# Patient Record
Sex: Female | Born: 2000 | Race: Black or African American | Hispanic: No | Marital: Single | Smoking: Never smoker
Health system: Southern US, Community
[De-identification: ages and names within clinical notes are randomized; demographics above are authoritative.]

## PROBLEM LIST (undated history)

## (undated) DIAGNOSIS — F88 Other disorders of psychological development: Secondary | ICD-10-CM

## (undated) DIAGNOSIS — Z8619 Personal history of other infectious and parasitic diseases: Secondary | ICD-10-CM

## (undated) DIAGNOSIS — F809 Developmental disorder of speech and language, unspecified: Secondary | ICD-10-CM

## (undated) HISTORY — DX: Developmental disorder of speech and language, unspecified: F80.9

## (undated) HISTORY — DX: Personal history of other infectious and parasitic diseases: Z86.19

## (undated) HISTORY — DX: Other disorders of psychological development: F88

---
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
## 2022-01-04 DIAGNOSIS — F329 Major depressive disorder, single episode, unspecified: Secondary | ICD-10-CM | POA: Diagnosis not present

## 2022-01-09 DIAGNOSIS — F4310 Post-traumatic stress disorder, unspecified: Secondary | ICD-10-CM | POA: Diagnosis not present

## 2022-01-11 DIAGNOSIS — F329 Major depressive disorder, single episode, unspecified: Secondary | ICD-10-CM | POA: Diagnosis not present

## 2022-01-18 DIAGNOSIS — F329 Major depressive disorder, single episode, unspecified: Secondary | ICD-10-CM | POA: Diagnosis not present

## 2022-01-22 DIAGNOSIS — Z419 Encounter for procedure for purposes other than remedying health state, unspecified: Secondary | ICD-10-CM | POA: Diagnosis not present

## 2022-01-25 DIAGNOSIS — F329 Major depressive disorder, single episode, unspecified: Secondary | ICD-10-CM | POA: Diagnosis not present

## 2022-02-01 DIAGNOSIS — F329 Major depressive disorder, single episode, unspecified: Secondary | ICD-10-CM | POA: Diagnosis not present

## 2022-02-08 DIAGNOSIS — F329 Major depressive disorder, single episode, unspecified: Secondary | ICD-10-CM | POA: Diagnosis not present

## 2022-02-12 DIAGNOSIS — F4310 Post-traumatic stress disorder, unspecified: Secondary | ICD-10-CM | POA: Diagnosis not present

## 2022-02-15 DIAGNOSIS — F329 Major depressive disorder, single episode, unspecified: Secondary | ICD-10-CM | POA: Diagnosis not present

## 2022-02-22 DIAGNOSIS — Z419 Encounter for procedure for purposes other than remedying health state, unspecified: Secondary | ICD-10-CM | POA: Diagnosis not present

## 2022-02-22 DIAGNOSIS — F329 Major depressive disorder, single episode, unspecified: Secondary | ICD-10-CM | POA: Diagnosis not present

## 2022-02-26 DIAGNOSIS — F329 Major depressive disorder, single episode, unspecified: Secondary | ICD-10-CM | POA: Diagnosis not present

## 2022-03-06 DIAGNOSIS — F329 Major depressive disorder, single episode, unspecified: Secondary | ICD-10-CM | POA: Diagnosis not present

## 2022-03-08 DIAGNOSIS — F4310 Post-traumatic stress disorder, unspecified: Secondary | ICD-10-CM | POA: Diagnosis not present

## 2022-03-13 DIAGNOSIS — F329 Major depressive disorder, single episode, unspecified: Secondary | ICD-10-CM | POA: Diagnosis not present

## 2022-03-14 DIAGNOSIS — F4310 Post-traumatic stress disorder, unspecified: Secondary | ICD-10-CM | POA: Diagnosis not present

## 2022-03-20 DIAGNOSIS — F4310 Post-traumatic stress disorder, unspecified: Secondary | ICD-10-CM | POA: Diagnosis not present

## 2022-03-24 DIAGNOSIS — Z419 Encounter for procedure for purposes other than remedying health state, unspecified: Secondary | ICD-10-CM | POA: Diagnosis not present

## 2022-03-27 DIAGNOSIS — F329 Major depressive disorder, single episode, unspecified: Secondary | ICD-10-CM | POA: Diagnosis not present

## 2022-04-03 DIAGNOSIS — F329 Major depressive disorder, single episode, unspecified: Secondary | ICD-10-CM | POA: Diagnosis not present

## 2022-04-10 DIAGNOSIS — F329 Major depressive disorder, single episode, unspecified: Secondary | ICD-10-CM | POA: Diagnosis not present

## 2022-04-17 DIAGNOSIS — F329 Major depressive disorder, single episode, unspecified: Secondary | ICD-10-CM | POA: Diagnosis not present

## 2022-04-24 DIAGNOSIS — Z419 Encounter for procedure for purposes other than remedying health state, unspecified: Secondary | ICD-10-CM | POA: Diagnosis not present

## 2022-04-24 DIAGNOSIS — F329 Major depressive disorder, single episode, unspecified: Secondary | ICD-10-CM | POA: Diagnosis not present

## 2022-04-26 DIAGNOSIS — F4310 Post-traumatic stress disorder, unspecified: Secondary | ICD-10-CM | POA: Diagnosis not present

## 2022-05-01 DIAGNOSIS — F329 Major depressive disorder, single episode, unspecified: Secondary | ICD-10-CM | POA: Diagnosis not present

## 2022-05-03 DIAGNOSIS — F4310 Post-traumatic stress disorder, unspecified: Secondary | ICD-10-CM | POA: Diagnosis not present

## 2022-05-08 DIAGNOSIS — F329 Major depressive disorder, single episode, unspecified: Secondary | ICD-10-CM | POA: Diagnosis not present

## 2022-05-10 DIAGNOSIS — F4310 Post-traumatic stress disorder, unspecified: Secondary | ICD-10-CM | POA: Diagnosis not present

## 2022-05-15 DIAGNOSIS — F329 Major depressive disorder, single episode, unspecified: Secondary | ICD-10-CM | POA: Diagnosis not present

## 2022-05-20 DIAGNOSIS — F4310 Post-traumatic stress disorder, unspecified: Secondary | ICD-10-CM | POA: Diagnosis not present

## 2022-05-22 DIAGNOSIS — F329 Major depressive disorder, single episode, unspecified: Secondary | ICD-10-CM | POA: Diagnosis not present

## 2022-05-24 DIAGNOSIS — Z419 Encounter for procedure for purposes other than remedying health state, unspecified: Secondary | ICD-10-CM | POA: Diagnosis not present

## 2022-05-29 DIAGNOSIS — F329 Major depressive disorder, single episode, unspecified: Secondary | ICD-10-CM | POA: Diagnosis not present

## 2022-05-31 DIAGNOSIS — F4310 Post-traumatic stress disorder, unspecified: Secondary | ICD-10-CM | POA: Diagnosis not present

## 2022-06-05 DIAGNOSIS — F329 Major depressive disorder, single episode, unspecified: Secondary | ICD-10-CM | POA: Diagnosis not present

## 2022-06-10 DIAGNOSIS — F329 Major depressive disorder, single episode, unspecified: Secondary | ICD-10-CM | POA: Diagnosis not present

## 2022-06-12 DIAGNOSIS — F329 Major depressive disorder, single episode, unspecified: Secondary | ICD-10-CM | POA: Diagnosis not present

## 2022-06-14 DIAGNOSIS — F4310 Post-traumatic stress disorder, unspecified: Secondary | ICD-10-CM | POA: Diagnosis not present

## 2022-06-16 DIAGNOSIS — H5213 Myopia, bilateral: Secondary | ICD-10-CM | POA: Diagnosis not present

## 2022-06-19 DIAGNOSIS — F329 Major depressive disorder, single episode, unspecified: Secondary | ICD-10-CM | POA: Diagnosis not present

## 2022-06-21 DIAGNOSIS — F329 Major depressive disorder, single episode, unspecified: Secondary | ICD-10-CM | POA: Diagnosis not present

## 2022-06-24 DIAGNOSIS — Z419 Encounter for procedure for purposes other than remedying health state, unspecified: Secondary | ICD-10-CM | POA: Diagnosis not present

## 2022-06-25 DIAGNOSIS — F329 Major depressive disorder, single episode, unspecified: Secondary | ICD-10-CM | POA: Diagnosis not present

## 2022-06-28 DIAGNOSIS — F4310 Post-traumatic stress disorder, unspecified: Secondary | ICD-10-CM | POA: Diagnosis not present

## 2022-07-25 DIAGNOSIS — Z419 Encounter for procedure for purposes other than remedying health state, unspecified: Secondary | ICD-10-CM | POA: Diagnosis not present

## 2022-08-23 DIAGNOSIS — Z419 Encounter for procedure for purposes other than remedying health state, unspecified: Secondary | ICD-10-CM | POA: Diagnosis not present

## 2022-09-23 DIAGNOSIS — Z419 Encounter for procedure for purposes other than remedying health state, unspecified: Secondary | ICD-10-CM | POA: Diagnosis not present

## 2022-10-23 DIAGNOSIS — Z419 Encounter for procedure for purposes other than remedying health state, unspecified: Secondary | ICD-10-CM | POA: Diagnosis not present

## 2022-11-23 DIAGNOSIS — Z419 Encounter for procedure for purposes other than remedying health state, unspecified: Secondary | ICD-10-CM | POA: Diagnosis not present

## 2022-12-23 DIAGNOSIS — Z419 Encounter for procedure for purposes other than remedying health state, unspecified: Secondary | ICD-10-CM | POA: Diagnosis not present

## 2023-01-23 DIAGNOSIS — Z419 Encounter for procedure for purposes other than remedying health state, unspecified: Secondary | ICD-10-CM | POA: Diagnosis not present

## 2023-02-23 DIAGNOSIS — Z419 Encounter for procedure for purposes other than remedying health state, unspecified: Secondary | ICD-10-CM | POA: Diagnosis not present

## 2023-03-25 DIAGNOSIS — Z419 Encounter for procedure for purposes other than remedying health state, unspecified: Secondary | ICD-10-CM | POA: Diagnosis not present

## 2023-04-25 DIAGNOSIS — Z419 Encounter for procedure for purposes other than remedying health state, unspecified: Secondary | ICD-10-CM | POA: Diagnosis not present

## 2023-04-30 DIAGNOSIS — F419 Anxiety disorder, unspecified: Secondary | ICD-10-CM | POA: Diagnosis not present

## 2023-05-07 DIAGNOSIS — F419 Anxiety disorder, unspecified: Secondary | ICD-10-CM | POA: Diagnosis not present

## 2023-05-16 DIAGNOSIS — F419 Anxiety disorder, unspecified: Secondary | ICD-10-CM | POA: Diagnosis not present

## 2023-05-19 DIAGNOSIS — F419 Anxiety disorder, unspecified: Secondary | ICD-10-CM | POA: Diagnosis not present

## 2023-05-25 DIAGNOSIS — Z419 Encounter for procedure for purposes other than remedying health state, unspecified: Secondary | ICD-10-CM | POA: Diagnosis not present

## 2023-05-30 DIAGNOSIS — F419 Anxiety disorder, unspecified: Secondary | ICD-10-CM | POA: Diagnosis not present

## 2023-06-02 DIAGNOSIS — F419 Anxiety disorder, unspecified: Secondary | ICD-10-CM | POA: Diagnosis not present

## 2023-06-13 DIAGNOSIS — F419 Anxiety disorder, unspecified: Secondary | ICD-10-CM | POA: Diagnosis not present

## 2023-06-19 DIAGNOSIS — F419 Anxiety disorder, unspecified: Secondary | ICD-10-CM | POA: Diagnosis not present

## 2023-06-25 DIAGNOSIS — Z419 Encounter for procedure for purposes other than remedying health state, unspecified: Secondary | ICD-10-CM | POA: Diagnosis not present

## 2023-06-28 DIAGNOSIS — F419 Anxiety disorder, unspecified: Secondary | ICD-10-CM | POA: Diagnosis not present

## 2023-07-02 DIAGNOSIS — F419 Anxiety disorder, unspecified: Secondary | ICD-10-CM | POA: Diagnosis not present

## 2023-07-09 DIAGNOSIS — F419 Anxiety disorder, unspecified: Secondary | ICD-10-CM | POA: Diagnosis not present

## 2023-07-14 DIAGNOSIS — F419 Anxiety disorder, unspecified: Secondary | ICD-10-CM | POA: Diagnosis not present

## 2023-07-21 DIAGNOSIS — F419 Anxiety disorder, unspecified: Secondary | ICD-10-CM | POA: Diagnosis not present

## 2023-07-24 ENCOUNTER — Encounter: Payer: Self-pay | Admitting: Family Medicine

## 2023-07-24 ENCOUNTER — Ambulatory Visit: Payer: Medicaid Other | Admitting: Family Medicine

## 2023-07-24 VITALS — BP 117/91 | HR 93 | Ht 59.0 in | Wt 139.8 lb

## 2023-07-24 DIAGNOSIS — Z0000 Encounter for general adult medical examination without abnormal findings: Secondary | ICD-10-CM | POA: Diagnosis not present

## 2023-07-24 DIAGNOSIS — Z309 Encounter for contraceptive management, unspecified: Secondary | ICD-10-CM | POA: Diagnosis not present

## 2023-07-24 LAB — POCT URINE PREGNANCY: Preg Test, Ur: NEGATIVE

## 2023-07-24 NOTE — Patient Instructions (Signed)
It was great to see you again today.  Come back in March as scheduled for your pap smear  Your nexplanon does not need to be replaced until a year from now  Be well, Dr. Pollie Meyer

## 2023-07-24 NOTE — Progress Notes (Signed)
  Date of Visit: 07/24/2023   SUBJECTIVE:   HPI:  Jacklin presents today for Nexplanon removal and replacement.  Nexplanon was placed on 08/07/2021.  She inadvertently thought it was placed on 08/07/2020, got the dates mixed up and thought she needed it removed this year.  She likes this form of birth control and wants to continue using it.  She is sexually active with a female partner, declines STI testing today.   OBJECTIVE:   BP (!) 117/91   Pulse 93   Ht 4\' 11"  (1.499 m)   Wt 139 lb 12.8 oz (63.4 kg)   SpO2 100%   BMI 28.24 kg/m  Gen: No acute distress, pleasant, cooperative, well-appearing HEENT: Normocephalic, atraumatic Extremities: Nexplanon palpated under skin in right arm, with distal end approximately 13 cm proximal to medial epicondyle   ASSESSMENT/PLAN:   Assessment & Plan Encounter for contraceptive management, unspecified type Has another year left with her Nexplanon, so it was not removed today Noted Nexplanon is located more proximally in her arm than is typical.  Advised when it is removed and replaced, I will plan to insert it in a different site of her arm to ensure proper placement. Follow-up in 1 year for removal Routine adult health maintenance Declines COVID, flu, Tdap, HPV vaccines today Also declines HIV and hep C testing today Advised that she will soon be due for Pap smear as she is turning 23, visit scheduled after her 23st birthday    FOLLOW UP: Follow up in March for pap smear  [place] J. Pollie Meyer, MD [facility]

## 2023-07-26 DIAGNOSIS — Z0000 Encounter for general adult medical examination without abnormal findings: Secondary | ICD-10-CM | POA: Insufficient documentation

## 2023-07-26 DIAGNOSIS — Z419 Encounter for procedure for purposes other than remedying health state, unspecified: Secondary | ICD-10-CM | POA: Diagnosis not present

## 2023-07-26 NOTE — Assessment & Plan Note (Signed)
Has another year left with her Nexplanon, so it was not removed today Noted Nexplanon is located more proximally in her arm than is typical.  Advised when it is removed and replaced, I will plan to insert it in a different site of her arm to ensure proper placement. Follow-up in 1 year for removal

## 2023-07-26 NOTE — Assessment & Plan Note (Signed)
Declines COVID, flu, Tdap, HPV vaccines today Also declines HIV and hep C testing today Advised that she will soon be due for Pap smear as she is turning 23, visit scheduled after her 23st birthday

## 2023-07-28 DIAGNOSIS — F419 Anxiety disorder, unspecified: Secondary | ICD-10-CM | POA: Diagnosis not present

## 2023-08-09 DIAGNOSIS — F419 Anxiety disorder, unspecified: Secondary | ICD-10-CM | POA: Diagnosis not present

## 2023-08-14 DIAGNOSIS — F419 Anxiety disorder, unspecified: Secondary | ICD-10-CM | POA: Diagnosis not present

## 2023-08-18 DIAGNOSIS — F419 Anxiety disorder, unspecified: Secondary | ICD-10-CM | POA: Diagnosis not present

## 2023-08-23 DIAGNOSIS — Z419 Encounter for procedure for purposes other than remedying health state, unspecified: Secondary | ICD-10-CM | POA: Diagnosis not present

## 2023-08-25 ENCOUNTER — Other Ambulatory Visit (HOSPITAL_COMMUNITY)
Admission: RE | Admit: 2023-08-25 | Discharge: 2023-08-25 | Disposition: A | Discharge status: Home / Self Care | Source: Ambulatory Visit | Attending: Family Medicine | Admitting: Family Medicine

## 2023-08-25 ENCOUNTER — Encounter: Payer: Self-pay | Admitting: Family Medicine

## 2023-08-25 ENCOUNTER — Ambulatory Visit (INDEPENDENT_AMBULATORY_CARE_PROVIDER_SITE_OTHER): Payer: Medicaid Other | Admitting: Family Medicine

## 2023-08-25 VITALS — BP 110/78 | HR 92 | Ht 59.0 in | Wt 139.2 lb

## 2023-08-25 DIAGNOSIS — Z124 Encounter for screening for malignant neoplasm of cervix: Secondary | ICD-10-CM | POA: Diagnosis not present

## 2023-08-25 DIAGNOSIS — Z0000 Encounter for general adult medical examination without abnormal findings: Secondary | ICD-10-CM | POA: Diagnosis not present

## 2023-08-25 NOTE — Patient Instructions (Addendum)
 It was great to see you again today.  Pap smear and STI screening today Follow up next January for nexplanon replacement  Be well, Dr. Pollie Meyer

## 2023-08-25 NOTE — Progress Notes (Unsigned)
  Date of Visit: 08/25/2023   SUBJECTIVE:   HPI:  Jasmine Gay presents today for a well woman exam.   Concerns today: none Periods: monthly, no concerns Contraception: nexplanon Pelvic symptoms: denies pelvic pain or vaginal dc STD Screening: desires today but declines bloodwork Pap smear status: due for first pap today Exercise: no regular exercise, counseled Smoking: no Alcohol: no Drugs: no Mood: no concerns Dentist: yes goes regularly Cancers in family: none  OBJECTIVE:   BP 110/78   Pulse 92   Ht 4\' 11"  (1.499 m)   Wt 139 lb 3.2 oz (63.1 kg)   LMP  (LMP Unknown)   SpO2 99%   BMI 28.11 kg/m  Gen: NAD, pleasant, cooperative HEENT: NCAT, PERRL, no palpable thyromegaly or anterior cervical lymphadenopathy Heart: RRR, no murmurs Lungs: CTAB, NWOB Abdomen: soft, nontender to palpation Neuro: grossly nonfocal, speech normal GU: normal appearing external genitalia without lesions. Vagina is moist with slight brownish discharge (period starting soon). Cervix normal in appearance. No cervical motion tenderness or tenderness on bimanual exam. No adnexal masses.   ASSESSMENT/PLAN:   Assessment & Plan Routine adult health maintenance -STD screening: gc/chl/trich done with pap today, declines HIV/RPR/hep C blood draws -pap smear: collected first pap smear today -immunizations: declines all immunizations today, counseled -handout given on health maintenance topics  FOLLOW UP: Follow up in 1 year for nexplanon removal/replacement  [place] J. Pollie Meyer, MD [facility]

## 2023-08-25 NOTE — Assessment & Plan Note (Signed)
-  STD screening: gc/chl/trich done with pap today, declines HIV/RPR/hep C blood draws -pap smear: collected first pap smear today -immunizations: declines all immunizations today, counseled -handout given on health maintenance topics

## 2023-08-27 ENCOUNTER — Encounter: Payer: Self-pay | Admitting: Family Medicine

## 2023-08-27 DIAGNOSIS — F419 Anxiety disorder, unspecified: Secondary | ICD-10-CM | POA: Diagnosis not present

## 2023-08-27 LAB — CYTOLOGY - PAP
Chlamydia: NEGATIVE
Comment: NEGATIVE
Comment: NEGATIVE
Comment: NORMAL
Diagnosis: NEGATIVE
Neisseria Gonorrhea: NEGATIVE
Trichomonas: NEGATIVE

## 2023-09-04 DIAGNOSIS — F419 Anxiety disorder, unspecified: Secondary | ICD-10-CM | POA: Diagnosis not present

## 2023-09-11 DIAGNOSIS — F419 Anxiety disorder, unspecified: Secondary | ICD-10-CM | POA: Diagnosis not present

## 2023-09-19 DIAGNOSIS — F419 Anxiety disorder, unspecified: Secondary | ICD-10-CM | POA: Diagnosis not present

## 2023-09-25 DIAGNOSIS — F419 Anxiety disorder, unspecified: Secondary | ICD-10-CM | POA: Diagnosis not present

## 2023-10-03 DIAGNOSIS — F419 Anxiety disorder, unspecified: Secondary | ICD-10-CM | POA: Diagnosis not present

## 2023-10-04 DIAGNOSIS — Z419 Encounter for procedure for purposes other than remedying health state, unspecified: Secondary | ICD-10-CM | POA: Diagnosis not present

## 2023-10-09 DIAGNOSIS — F419 Anxiety disorder, unspecified: Secondary | ICD-10-CM | POA: Diagnosis not present

## 2023-10-17 DIAGNOSIS — F419 Anxiety disorder, unspecified: Secondary | ICD-10-CM | POA: Diagnosis not present

## 2023-10-30 DIAGNOSIS — F419 Anxiety disorder, unspecified: Secondary | ICD-10-CM | POA: Diagnosis not present

## 2023-11-03 DIAGNOSIS — Z419 Encounter for procedure for purposes other than remedying health state, unspecified: Secondary | ICD-10-CM | POA: Diagnosis not present

## 2023-11-07 DIAGNOSIS — F419 Anxiety disorder, unspecified: Secondary | ICD-10-CM | POA: Diagnosis not present

## 2023-11-14 DIAGNOSIS — F419 Anxiety disorder, unspecified: Secondary | ICD-10-CM | POA: Diagnosis not present

## 2023-11-22 DIAGNOSIS — F419 Anxiety disorder, unspecified: Secondary | ICD-10-CM | POA: Diagnosis not present

## 2023-11-27 DIAGNOSIS — F419 Anxiety disorder, unspecified: Secondary | ICD-10-CM | POA: Diagnosis not present

## 2023-12-04 DIAGNOSIS — F419 Anxiety disorder, unspecified: Secondary | ICD-10-CM | POA: Diagnosis not present

## 2023-12-04 DIAGNOSIS — Z419 Encounter for procedure for purposes other than remedying health state, unspecified: Secondary | ICD-10-CM | POA: Diagnosis not present

## 2023-12-11 DIAGNOSIS — F419 Anxiety disorder, unspecified: Secondary | ICD-10-CM | POA: Diagnosis not present

## 2023-12-18 DIAGNOSIS — F419 Anxiety disorder, unspecified: Secondary | ICD-10-CM | POA: Diagnosis not present

## 2023-12-27 DIAGNOSIS — F419 Anxiety disorder, unspecified: Secondary | ICD-10-CM | POA: Diagnosis not present

## 2023-12-31 DIAGNOSIS — F419 Anxiety disorder, unspecified: Secondary | ICD-10-CM | POA: Diagnosis not present

## 2024-01-03 DIAGNOSIS — Z419 Encounter for procedure for purposes other than remedying health state, unspecified: Secondary | ICD-10-CM | POA: Diagnosis not present

## 2024-01-05 DIAGNOSIS — F419 Anxiety disorder, unspecified: Secondary | ICD-10-CM | POA: Diagnosis not present

## 2024-01-13 DIAGNOSIS — F419 Anxiety disorder, unspecified: Secondary | ICD-10-CM | POA: Diagnosis not present

## 2024-01-27 DIAGNOSIS — F419 Anxiety disorder, unspecified: Secondary | ICD-10-CM | POA: Diagnosis not present

## 2024-02-03 DIAGNOSIS — Z419 Encounter for procedure for purposes other than remedying health state, unspecified: Secondary | ICD-10-CM | POA: Diagnosis not present

## 2024-02-07 DIAGNOSIS — F419 Anxiety disorder, unspecified: Secondary | ICD-10-CM | POA: Diagnosis not present

## 2024-02-13 DIAGNOSIS — F419 Anxiety disorder, unspecified: Secondary | ICD-10-CM | POA: Diagnosis not present

## 2024-02-17 DIAGNOSIS — F419 Anxiety disorder, unspecified: Secondary | ICD-10-CM | POA: Diagnosis not present

## 2024-02-28 DIAGNOSIS — F419 Anxiety disorder, unspecified: Secondary | ICD-10-CM | POA: Diagnosis not present

## 2024-03-05 DIAGNOSIS — F419 Anxiety disorder, unspecified: Secondary | ICD-10-CM | POA: Diagnosis not present

## 2024-03-05 DIAGNOSIS — Z419 Encounter for procedure for purposes other than remedying health state, unspecified: Secondary | ICD-10-CM | POA: Diagnosis not present

## 2024-03-13 DIAGNOSIS — F419 Anxiety disorder, unspecified: Secondary | ICD-10-CM | POA: Diagnosis not present

## 2024-03-19 DIAGNOSIS — F419 Anxiety disorder, unspecified: Secondary | ICD-10-CM | POA: Diagnosis not present

## 2024-03-21 ENCOUNTER — Other Ambulatory Visit: Payer: Self-pay

## 2024-03-21 ENCOUNTER — Encounter (HOSPITAL_COMMUNITY): Payer: Self-pay | Admitting: *Deleted

## 2024-03-21 ENCOUNTER — Ambulatory Visit (HOSPITAL_COMMUNITY)
Admission: EM | Admit: 2024-03-21 | Discharge: 2024-03-21 | Disposition: A | Discharge status: Home / Self Care | Attending: Physician Assistant | Admitting: Physician Assistant

## 2024-03-21 DIAGNOSIS — R1030 Lower abdominal pain, unspecified: Secondary | ICD-10-CM

## 2024-03-21 LAB — POCT URINALYSIS DIP (MANUAL ENTRY)
Bilirubin, UA: NEGATIVE
Glucose, UA: NEGATIVE mg/dL
Ketones, POC UA: NEGATIVE mg/dL
Leukocytes, UA: NEGATIVE
Nitrite, UA: NEGATIVE
Protein Ur, POC: NEGATIVE mg/dL
Spec Grav, UA: 1.02 (ref 1.010–1.025)
Urobilinogen, UA: 1 U/dL
pH, UA: 6.5 (ref 5.0–8.0)

## 2024-03-21 LAB — POCT URINE PREGNANCY: Preg Test, Ur: NEGATIVE

## 2024-03-21 MED ORDER — NAPROXEN 500 MG PO TABS: 500.0000 mg | ORAL_TABLET | Freq: Two times a day (BID) | ORAL | 0 refills | Status: AC

## 2024-03-21 NOTE — ED Provider Notes (Signed)
 [facility]    CSN: [id number] Arrival date & time: 03/21/24  1343      History   Chief Complaint Chief Complaint  Patient presents with   Abdominal Pain    HPI Jasmine Gay is a 23 y.o. female.   Patient here c/w LLQ abdominal pain x 4 days.  LMP 4 days ago.  Denies f/c, n/v/d/c, hematochezia, melena, vaginal discharge, dysuria, frequency, urgency.  No abdominal surgeries.  She hasn't taken anything for pain.      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 No pertinent surgical history.  OB History   No obstetric history on file.      Home Medications    Prior to Admission medications   Medication Sig Start Date End Date Taking? Authorizing Provider  naproxen (NAPROSYN) 500 MG tablet Take 1 tablet (500 mg total) by mouth 2 (two) times daily. 03/21/24  Yes Juleen Rush, PA-C    Family History Family History  Problem Relation Age of Onset   Hypertension Mother    Hypertension Father    Hypertension Maternal Grandmother    Hypertension Paternal Grandmother    Cancer Paternal Grandmother    Hypertension Paternal Grandfather     Social History Social History   Tobacco Use   Smoking status: Never   Smokeless tobacco: Never     Allergies   Patient has no known allergies.   Review of Systems Review of Systems  Constitutional:  Negative for chills, fatigue and fever.  Gastrointestinal:  Positive for abdominal pain. Negative for blood in stool, constipation, nausea and vomiting.  Genitourinary:  Negative for decreased urine volume, difficulty urinating, dysuria, flank pain,  frequency, genital sores, hematuria, pelvic pain, urgency, vaginal bleeding, vaginal discharge and vaginal pain.  Musculoskeletal:  Negative for back pain.  Skin:  Negative for rash and wound.  Allergic/Immunologic: Negative for environmental allergies and immunocompromised state.  Neurological:  Negative for headaches.  Hematological:  Negative for adenopathy. Does not bruise/bleed easily.  Psychiatric/Behavioral:  Negative for sleep disturbance.      Physical Exam Triage Vital Signs ED Triage Vitals  Encounter Vitals Group     BP 03/21/24 1519 135/84     Girls Systolic BP Percentile --      Girls Diastolic BP Percentile --      Boys Systolic BP Percentile --      Boys Diastolic BP Percentile --      Pulse Rate 03/21/24 1519 84     Resp 03/21/24 1519 18     Temp 03/21/24 1519 98 F (36.7 C)     Temp src --      SpO2 03/21/24 1519 98 %     Weight --      Height --      Head Circumference --      Peak Flow --      Pain Score 03/21/24 1518 7     Pain Loc --      Pain Education --      Exclude from Growth Chart --    No data found.  Updated Vital Signs BP 135/84  Pulse 84   Temp 98 F (36.7 C)   Resp 18   LMP 03/19/2024   SpO2 98%   Visual Acuity Right Eye Distance:   Left Eye Distance:   Bilateral Distance:    Right Eye Near:   Left Eye Near:    Bilateral Near:     Physical Exam Vitals and nursing note reviewed.  Constitutional:      General: She is not in acute distress.    Appearance: Normal appearance. She is well-developed. She is not ill-appearing.  HENT:     Head: Normocephalic and atraumatic.     Nose: Nose normal.  Eyes:     General: No scleral icterus.    Extraocular Movements: Extraocular movements intact.     Conjunctiva/sclera: Conjunctivae normal.  Pulmonary:     Effort: Pulmonary effort is normal. No respiratory distress.  Abdominal:     General: Bowel sounds are normal. There is no distension.     Palpations: Abdomen is soft.      Tenderness: There is no abdominal tenderness. There is no right CVA tenderness, left CVA tenderness, guarding or rebound.  Musculoskeletal:     Cervical back: Normal range of motion and neck supple. No rigidity.  Skin:    General: Skin is warm and dry.  Neurological:     General: No focal deficit present.     Mental Status: She is alert and oriented to person, place, and time.     Motor: No weakness.     Gait: Gait normal.  Psychiatric:        Mood and Affect: Mood normal.        Behavior: Behavior normal.      UC Treatments / Results  Labs (all labs ordered are listed, but only abnormal results are displayed) Labs Reviewed  POCT URINALYSIS DIP (MANUAL ENTRY) - Abnormal; Notable for the following components:      Result Value   Clarity, UA turbid (*)    Blood, UA large (*)    All other components within normal limits  POCT URINE PREGNANCY    EKG   Radiology No results found.  Procedures Procedures (including critical care time)  Medications Ordered in UC Medications - No data to display  Initial Impression / Assessment and Plan / UC Course  I have reviewed the triage vital signs and the nursing notes.  Pertinent labs & imaging results that were available during my care of the patient were reviewed by me and considered in my medical decision making (see chart for details).     Ua unremarkable, some blood but currently on her cycle Abdomen soft, non-tender, non-acute Ddx include ovarian cyst, ovarian torsion, appendicitis, LBO/SBO.  Ovarian cyst most likely, however, unable to definitively rule out other more serous causes.  Discussed this limitation with patient, she is aware and verbalizes  Final Clinical Impressions(s) / UC Diagnoses   Final diagnoses:  Lower abdominal pain     Discharge Instructions      Follow up with PCP Go to ED if symptoms worsen or fail to improve   ED Prescriptions     Medication Sig Dispense Auth. Provider   naproxen  (NAPROSYN) 500 MG tablet Take 1 tablet (500 mg total) by mouth 2 (two) times daily. 20 tablet Juleen Rush, PA-C      PDMP not reviewed this encounter.   Juleen Rush, PA-C 03/21/24 1619

## 2024-03-21 NOTE — ED Triage Notes (Signed)
 PT reports Lt sided ABD pain since Wednesday.Pt denies N/V/D.

## 2024-03-21 NOTE — Discharge Instructions (Addendum)
 Follow up with PCP Go to ED if symptoms worsen or fail to improve

## 2024-03-22 ENCOUNTER — Ambulatory Visit

## 2024-03-27 DIAGNOSIS — F419 Anxiety disorder, unspecified: Secondary | ICD-10-CM | POA: Diagnosis not present

## 2024-04-03 DIAGNOSIS — F419 Anxiety disorder, unspecified: Secondary | ICD-10-CM | POA: Diagnosis not present

## 2024-04-04 DIAGNOSIS — Z419 Encounter for procedure for purposes other than remedying health state, unspecified: Secondary | ICD-10-CM | POA: Diagnosis not present

## 2024-04-08 DIAGNOSIS — F419 Anxiety disorder, unspecified: Secondary | ICD-10-CM | POA: Diagnosis not present

## 2024-04-26 DIAGNOSIS — F419 Anxiety disorder, unspecified: Secondary | ICD-10-CM | POA: Diagnosis not present

## 2024-05-03 DIAGNOSIS — F419 Anxiety disorder, unspecified: Secondary | ICD-10-CM | POA: Diagnosis not present

## 2024-05-10 DIAGNOSIS — F419 Anxiety disorder, unspecified: Secondary | ICD-10-CM | POA: Diagnosis not present

## 2024-05-17 DIAGNOSIS — F419 Anxiety disorder, unspecified: Secondary | ICD-10-CM | POA: Diagnosis not present

## 2024-06-04 DIAGNOSIS — Z419 Encounter for procedure for purposes other than remedying health state, unspecified: Secondary | ICD-10-CM | POA: Diagnosis not present

## 2024-07-01 ENCOUNTER — Emergency Department (HOSPITAL_BASED_OUTPATIENT_CLINIC_OR_DEPARTMENT_OTHER)

## 2024-07-01 ENCOUNTER — Encounter (HOSPITAL_BASED_OUTPATIENT_CLINIC_OR_DEPARTMENT_OTHER): Payer: Self-pay

## 2024-07-01 ENCOUNTER — Other Ambulatory Visit: Payer: Self-pay

## 2024-07-01 ENCOUNTER — Emergency Department (HOSPITAL_BASED_OUTPATIENT_CLINIC_OR_DEPARTMENT_OTHER)
Admission: EM | Admit: 2024-07-01 | Discharge: 2024-07-01 | Disposition: A | Discharge status: Home / Self Care | Attending: Emergency Medicine | Admitting: Emergency Medicine

## 2024-07-01 DIAGNOSIS — S46812A Strain of other muscles, fascia and tendons at shoulder and upper arm level, left arm, initial encounter: Secondary | ICD-10-CM | POA: Insufficient documentation

## 2024-07-01 DIAGNOSIS — R0789 Other chest pain: Secondary | ICD-10-CM

## 2024-07-01 DIAGNOSIS — W010XXA Fall on same level from slipping, tripping and stumbling without subsequent striking against object, initial encounter: Secondary | ICD-10-CM | POA: Diagnosis not present

## 2024-07-01 DIAGNOSIS — Y990 Civilian activity done for income or pay: Secondary | ICD-10-CM | POA: Diagnosis not present

## 2024-07-01 DIAGNOSIS — W19XXXA Unspecified fall, initial encounter: Secondary | ICD-10-CM

## 2024-07-01 DIAGNOSIS — M25512 Pain in left shoulder: Secondary | ICD-10-CM | POA: Diagnosis present

## 2024-07-01 DIAGNOSIS — R0781 Pleurodynia: Secondary | ICD-10-CM | POA: Diagnosis not present

## 2024-07-01 MED ORDER — ACETAMINOPHEN 500 MG PO TABS
1000.0000 mg | ORAL_TABLET | Freq: Once | ORAL | Status: AC
  Administered 2024-07-01: 1000 mg via ORAL
  Filled 2024-07-01: qty 2

## 2024-07-01 MED ORDER — IBUPROFEN 400 MG PO TABS
600.0000 mg | ORAL_TABLET | Freq: Once | ORAL | Status: AC
  Administered 2024-07-01: 600 mg via ORAL
  Filled 2024-07-01: qty 1

## 2024-07-01 MED ORDER — LIDOCAINE 5 % EX PTCH
1.0000 | MEDICATED_PATCH | Freq: Once | CUTANEOUS | Status: DC
  Administered 2024-07-01: 1 via TRANSDERMAL
  Filled 2024-07-01: qty 1

## 2024-07-01 NOTE — Discharge Instructions (Signed)
 You were seen in the emergency department after your fall.  Your x-ray showed no broken bones or bruising or injury to your lungs.  You likely strained the muscle in your shoulder and bruised your ribs.  You can take Tylenol  or Motrin  every 6 hours as needed for pain as well as use lidocaine  patches.  Now that it has been more than a couple of days I recommend doing heat and stretches.  I would avoid heavy lifting for the next few days.  You can follow-up with your primary doctor to have your symptoms rechecked.  You should return to the emergency department for uncontrollable pain or any other new or concerning symptoms.

## 2024-07-01 NOTE — ED Notes (Signed)
Report received from Haley, RN. Assuming patient care at this time.

## 2024-07-01 NOTE — ED Provider Notes (Signed)
 " [facility] Provider Note   CSN: [id number] Arrival date & time: 07/01/24  1135     Patient presents with: Fall   Jasmine Gay is a 24 y.o. female.   Patient is a 24 year old female with no significant past medical history presenting to the emergency department with left-sided rib and shoulder pain.  Patient states on Tuesday she slipped and fell twice at work onto her left side.  She denies hitting her head or losing consciousness.  She states that her pain has been persistent since then.  She denies any numbness, weakness or shortness of breath, nausea or vomiting.  She states that she has been taking pain pills at home but is unsure of what she has been taking and states she did not take anything today.  The history is provided by the patient.  Fall       Prior to Admission medications  Medication Sig Start Date End Date Taking? Authorizing Provider  naproxen  (NAPROSYN ) 500 MG tablet Take 1 tablet (500 mg total) by mouth 2 (two) times daily. 03/21/24   Juleen Rush, PA-C    Allergies: Patient has no known allergies.    Review of Systems  Updated Vital Signs BP 124/70 (BP Location: Right Arm)   Pulse 69   Temp 98.2 F (36.8 C) (Oral)   Resp 16   Ht 4' 11 (1.499 m)   Wt 68.8 kg   LMP  (LMP Unknown)   SpO2 99%   BMI 30.63 kg/m   Physical Exam Vitals and nursing note reviewed.  Constitutional:      General: She is not in acute distress.    Appearance: Normal appearance.  HENT:     Head: Normocephalic and atraumatic.     Nose: Nose normal.     Mouth/Throat:     Mouth: Mucous membranes are moist.     Pharynx: Oropharynx is clear.  Eyes:     Extraocular Movements: Extraocular movements intact.     Conjunctiva/sclera: Conjunctivae normal.  Neck:     Comments: No midline neck tenderness Cardiovascular:     Rate and Rhythm: Normal rate and regular rhythm.     Pulses: Normal pulses.     Heart sounds: Normal  heart sounds.  Pulmonary:     Effort: Pulmonary effort is normal.     Breath sounds: Normal breath sounds.  Abdominal:     General: Abdomen is flat.     Palpations: Abdomen is soft.     Tenderness: There is no abdominal tenderness.  Musculoskeletal:        General: Normal range of motion.     Cervical back: Normal range of motion and neck supple.     Comments: No midline back tenderness No bony tenderness to RUE or LUE, tenderness to palpation of L trapezius muscle, shoulder ROM intact with increased pain with shoulder abduction L-sided lateral and posterior rib tenderness to palpation No bony tenderness to bilateral LE  Skin:    General: Skin is warm and dry.  Neurological:     General: No focal deficit present.     Mental Status: She is alert and oriented to person, place, and time.     Sensory: No sensory deficit.     Motor: No weakness.  Psychiatric:        Mood and Affect: Mood normal.        Behavior: Behavior normal.     (all labs ordered are listed, but only abnormal  results are displayed) Labs Reviewed - No data to display  EKG: None  Radiology: DG Ribs Unilateral W/Chest Left Result Date: 07/01/2024 CLINICAL DATA:  Fall EXAM: LEFT RIBS AND CHEST - 3+ VIEW COMPARISON:  June 30, 2009 FINDINGS: No fracture or other bone lesions are seen involving the ribs. There is no evidence of pneumothorax or pleural effusion. Both lungs are clear. Heart size and mediastinal contours are within normal limits. IMPRESSION: Negative. Electronically Signed   By: Lynwood Landy Raddle M.D.   On: 07/01/2024 13:36     Procedures   Medications Ordered in the ED  lidocaine  (LIDODERM ) 5 % 1-3 patch (1 patch Transdermal Patch Applied 07/01/24 1242)  acetaminophen  (TYLENOL ) tablet 1,000 mg (1,000 mg Oral Given 07/01/24 1242)  ibuprofen  (ADVIL ) tablet 600 mg (600 mg Oral Given 07/01/24 1241)    Clinical Course as of 07/01/24 1346  Thu Jul 01, 2024  1341 No acute traumatic injury on X-ray, likely  rib contusion. Patient is stable for discharge home with outpatient follow up. [VK]    Clinical Course User Index [VK] Kingsley, Maricia Scotti K, DO                                 Medical Decision Making This patient presents to the ED with chief complaint(s) of fall with no pertinent past medical history which further complicates the presenting complaint. The complaint involves an extensive differential diagnosis and also carries with it a high risk of complications and morbidity.    The differential diagnosis includes rib fracture, contusion, pneumothorax, hemothorax, shoulders strain or spasm, no bony tenderness in left upper extremity making fracture or dislocation unlikely, no neurologic deficits making nerve injury unlikely  Additional history obtained: Additional history obtained from N/A Records reviewed N/A  ED Course and Reassessment: On patient's arrival she is hemodynamically stable in no acute distress.  Does have tenderness to the left trapezius making muscle sprain likely.  No bony tenderness in left upper extremity.  Does have rib tenderness to palpation and will have rib x-ray performed.  She is given pain control and will be closely reassessed.  Independent labs interpretation:  N/A  Independent visualization of imaging: - I independently visualized the following imaging with scope of interpretation limited to determining acute life threatening conditions related to emergency care: L rib XR, which revealed no acute disease  Consultation: - Consulted or discussed management/test interpretation w/ external professional: N/A  Consideration for admission or further workup: Patient has no emergent conditions requiring admission or further work-up at this time and is stable for discharge home with primary care follow-up  Social Determinants of health: N/A    Amount and/or Complexity of Data Reviewed Radiology: ordered.  Risk OTC drugs. Prescription drug  management.       Final diagnoses:  Fall, initial encounter  Strain of left trapezius muscle, initial encounter  Rib pain on left side    ED Discharge Orders     None          Ellouise Richerd POUR, DO 07/01/24 1346  "

## 2024-07-01 NOTE — ED Triage Notes (Addendum)
 Pt reports falling at work today. Slipped on wet floor and landed on left side. Denies hitting head or loss of consciousness. Tender to palpation left lateral side
